# Patient Record
Sex: Female | Born: 1950 | Race: White | Hispanic: No | Marital: Married | Smoking: Never smoker
Health system: Southern US, Community
[De-identification: ages and names within clinical notes are randomized; demographics above are authoritative.]

## PROBLEM LIST (undated history)

## (undated) DIAGNOSIS — G9332 Myalgic encephalomyelitis/chronic fatigue syndrome: Secondary | ICD-10-CM

## (undated) DIAGNOSIS — M797 Fibromyalgia: Secondary | ICD-10-CM

## (undated) DIAGNOSIS — R5382 Chronic fatigue, unspecified: Secondary | ICD-10-CM

## (undated) DIAGNOSIS — N3010 Interstitial cystitis (chronic) without hematuria: Secondary | ICD-10-CM

## (undated) DIAGNOSIS — D849 Immunodeficiency, unspecified: Secondary | ICD-10-CM

## (undated) DIAGNOSIS — K589 Irritable bowel syndrome without diarrhea: Secondary | ICD-10-CM

## (undated) HISTORY — PX: APPENDECTOMY: SHX54

## (undated) HISTORY — PX: ABDOMINAL HYSTERECTOMY: SHX81

## (undated) HISTORY — PX: CHOLECYSTECTOMY: SHX55

---

## 1998-08-11 ENCOUNTER — Emergency Department (HOSPITAL_COMMUNITY): Admission: EM | Admit: 1998-08-11 | Discharge: 1998-08-11 | Payer: Self-pay | Admitting: Emergency Medicine

## 1998-08-11 ENCOUNTER — Encounter: Payer: Self-pay | Admitting: Emergency Medicine

## 1998-09-05 ENCOUNTER — Ambulatory Visit (HOSPITAL_COMMUNITY): Admission: RE | Admit: 1998-09-05 | Discharge: 1998-09-05 | Payer: Self-pay | Admitting: *Deleted

## 2000-12-24 ENCOUNTER — Ambulatory Visit (HOSPITAL_COMMUNITY): Admission: RE | Admit: 2000-12-24 | Discharge: 2000-12-24 | Payer: Self-pay | Admitting: *Deleted

## 2000-12-24 ENCOUNTER — Encounter (INDEPENDENT_AMBULATORY_CARE_PROVIDER_SITE_OTHER): Payer: Self-pay | Admitting: *Deleted

## 2001-02-08 ENCOUNTER — Encounter: Payer: Self-pay | Admitting: Urology

## 2001-02-11 ENCOUNTER — Encounter: Payer: Self-pay | Admitting: Urology

## 2001-02-11 ENCOUNTER — Encounter (INDEPENDENT_AMBULATORY_CARE_PROVIDER_SITE_OTHER): Payer: Self-pay | Admitting: Specialist

## 2001-02-11 ENCOUNTER — Ambulatory Visit (HOSPITAL_COMMUNITY): Admission: RE | Admit: 2001-02-11 | Discharge: 2001-02-11 | Payer: Self-pay | Admitting: Urology

## 2002-08-16 ENCOUNTER — Ambulatory Visit (HOSPITAL_COMMUNITY): Admission: RE | Admit: 2002-08-16 | Discharge: 2002-08-16 | Payer: Self-pay | Admitting: *Deleted

## 2003-05-23 ENCOUNTER — Encounter: Admission: RE | Admit: 2003-05-23 | Discharge: 2003-05-23 | Payer: Self-pay | Admitting: Urology

## 2003-05-25 ENCOUNTER — Ambulatory Visit (HOSPITAL_BASED_OUTPATIENT_CLINIC_OR_DEPARTMENT_OTHER): Admission: RE | Admit: 2003-05-25 | Discharge: 2003-05-25 | Payer: Self-pay | Admitting: Urology

## 2003-05-25 ENCOUNTER — Ambulatory Visit (HOSPITAL_COMMUNITY): Admission: RE | Admit: 2003-05-25 | Discharge: 2003-05-25 | Payer: Self-pay | Admitting: Urology

## 2004-04-29 ENCOUNTER — Ambulatory Visit (HOSPITAL_BASED_OUTPATIENT_CLINIC_OR_DEPARTMENT_OTHER): Admission: RE | Admit: 2004-04-29 | Discharge: 2004-04-29 | Payer: Self-pay | Admitting: Urology

## 2004-04-29 ENCOUNTER — Ambulatory Visit (HOSPITAL_COMMUNITY): Admission: RE | Admit: 2004-04-29 | Discharge: 2004-04-29 | Payer: Self-pay | Admitting: Urology

## 2005-05-15 ENCOUNTER — Ambulatory Visit (HOSPITAL_BASED_OUTPATIENT_CLINIC_OR_DEPARTMENT_OTHER): Admission: RE | Admit: 2005-05-15 | Discharge: 2005-05-15 | Payer: Self-pay | Admitting: Urology

## 2006-05-25 ENCOUNTER — Ambulatory Visit (HOSPITAL_BASED_OUTPATIENT_CLINIC_OR_DEPARTMENT_OTHER): Admission: RE | Admit: 2006-05-25 | Discharge: 2006-05-25 | Payer: Self-pay | Admitting: Urology

## 2007-05-10 ENCOUNTER — Ambulatory Visit (HOSPITAL_BASED_OUTPATIENT_CLINIC_OR_DEPARTMENT_OTHER): Admission: RE | Admit: 2007-05-10 | Discharge: 2007-05-10 | Payer: Self-pay | Admitting: Urology

## 2010-09-16 NOTE — Op Note (Signed)
NAME:  Connie Parker, Connie Parker NO.:  [id number]   MEDICAL RECORD NO.:  [id number]          PATIENT TYPE:  AMB   LOCATION:  [facility]                         FACILITY:  [facility]   PHYSICIAN:  Jamison Neighbor, M.D.  DATE OF BIRTH:  [birth year]   DATE OF PROCEDURE:  05/10/2007  DATE OF DISCHARGE:                               OPERATIVE REPORT   SERVICE:  Urology.   PREOPERATIVE DIAGNOSIS:  Interstitial cystitis/pelvic pain syndrome.   POSTOPERATIVE DIAGNOSIS:  Interstitial cystitis/pelvic pain syndrome.   PROCEDURE:  Cystoscopy, urethral calibration, hydrodistention of the  bladder, Marcaine and Pyridium installation, Marcaine and Kenalog  injection.   SURGEON:  Jamison Neighbor, M.D.   ANESTHESIA:  General.   COMPLICATIONS:  None.   DRAINS:  None.   BRIEF HISTORY:  This 60 year old female is known to have chronic pelvic  pain and has always responded in the past to hydrodistention.  The  patient has been on extensive medications for interstitial cystitis as  well as for her other problems including chronic fatigue syndrome,  fibromyalgia and irritable bowel syndrome.  She has requested a repeat  hydrodistention be performed.  She tends to get a good response.  This  can last at least a year or more.  She does know that there is no  guarantee that she will have a comparable response.  She gave full  informed consent.   PROCEDURE:  After successful induction of general anesthesia, the  patient was placed in the dorsal lithotomy position, prepped with  Betadine and draped in the usual sterile fashion.  Careful bimanual  examination revealed minimal pelvic laxity with no significant  cystocele.  The vault was pretty well-supported.  There was no real  rectocele.  The urethra was normal, accepting a 32-French female  urethral stent with no signs of stenosis or stricture.  The cystoscope  was inserted.  The bladder was carefully inspected.  No tumors or stones  could be seen.   Hydrodistention of the bladder was performed.  The  bladder was distended at a pressure of 100 cm of water for 5 minutes.  When the bladder was drained, no real granulations could be seen.  The  bladder capacity was normal at 1600 mL and if the patient did not have  previous evidence of a response to hydrodistention, one would consider  this to be a normal evaluation.  The bladder was drained.  A mixture of  Marcaine and Pyridium was left in the bladder.  Marcaine and Kenalog  mixture was injected periurethrally as a pudendal block.  The patient  tolerated the procedure  and was taken to the recovery room in good condition.  She received  intraoperative Toradol, Zofran as well as a B&O suppository.  She will  be sent home with Tylox, Pyridium Plus and doxycycline and return to the  office in 3 weeks time.      Jamison Neighbor, M.D.  Electronically Signed     RJE/MEDQ  D:  05/10/2007  T:  05/10/2007  Job:  [id number]

## 2010-09-19 NOTE — Op Note (Signed)
NAME:  Connie Parker, Connie Parker                         ACCOUNT NO.:  [id number]   MEDICAL RECORD NO.:  [id number]                   PATIENT TYPE:  AMB   LOCATION:  [facility]                                 FACILITY:  [facility]   PHYSICIAN:  Jamison Neighbor, M.D.               DATE OF BIRTH:  [birth year]   DATE OF PROCEDURE:  05/25/2003  DATE OF DISCHARGE:                                 OPERATIVE REPORT   PREOPERATIVE DIAGNOSIS:  Interstitial cystitis.   POSTOPERATIVE DIAGNOSIS:  Interstitial cystitis.   PROCEDURE:  Cystoscopy, urethral calibration, hydrodistention of the  bladder, Marcaine and Pyridium installation, Marcaine and Kenalog injection.   SURGEON:  Jamison Neighbor, M.D.   ANESTHESIA:  General.   COMPLICATIONS:  None.   DRAINS:  None.   BRIEF HISTORY:  This 60 year old female has interstitial cystitis and in  addition she suffers from a severe case of chronic fatigue syndrome.  The  patient had responded nicely to a combination of Elmiron, Atarax and  Neurontin as well as the medications that she takes for her chronic fatigue  syndrome which include __________, Wellbutrin, nizatidine, Maxzide,  Adderall, trazodone, Sinemet, Klonopin, Estraderm and NitroQuick for  esophageal spasms.  The patient recently, however, began to feel that her  symptoms were not as well controlled and she ascribes this to the fact that  she is now about two years out from her last hydrodistention.  We talked  about different options for the management of her recurrent frequency  including anticholinergics, bladder installation therapy, repeat  hydrodistention and Interstim. The patient would like to go ahead and be  redistended as she had such an excellent response to that the first time.  She is fully aware of the fact that there is no guarantee that that will  work the way it did before and that it may not last as long as the last  hydrodistention even if she does get a good clinical response. She  understands the risks and benefits of the procedure and gave full and  informed consent.   DESCRIPTION OF PROCEDURE:  After successful induction of general anesthesia,  the patient was placed in the dorsal lithotomy position, prepped with  Betadine and draped in the usual sterile fashion.  Careful bimanual  examination revealed no abnormalities of the urethra.  There was no  significant cystocele or rectocele, there was no vault prolapse or any  palpable masses. The urethra had no diverticulum. The urethra was calibrated  at 68 [place] with female urethral sounds without significant stenosis or  stricture. The cystoscope was inserted, the bladder was carefully inspected  and was free of any tumor or stones. Both ureteral orifices were normal in  configuration and location. The bladder mucosa had a very normal appearance  with no inflammatory changes, tumors or other abnormalities seen. The  ureters were each identified and were normal.  The urine coming out of each  was clear.  The hydrodistention was then performed, the bladder was  distended at a pressure of 100 cm of water for five minutes and when the  bladder was drained essentially no glomerulations were seen. This is a major  improvement from previous hdyrodistention. The patient's bladder capacity  has normalized at over 1500 mL.  The bladder was drained, a final inspection  showed nothing that needed biopsy.  A mixture of Marcaine and Pyridium was  left within the bladder, Marcaine and Kenalog were injected periurethrally.  The patient received intraoperative Toradol and Zofran.  She also received a  B&O suppository.  The patient tolerated the procedure well and was taken to  the recovery room in good condition. She will be sent home with Darvocet-N  100 which is one of the few pain medications she can tolerate as well as a  few days of Levaquin. She will return to my office in followup. If she has  not had a good clinical response  will talk to her about either installation  therapy or a trial of a newer anticholinergic such as ________.  The patient  has recently been on Oxytrol but still has frequency. The only other  alternative that we could consider is Interstim which may be a very  worthwhile therapy for this patient.                                               Jamison Neighbor, M.D.    RJE/MEDQ  D:  05/25/2003  T:  05/25/2003  Job:  [id number]   cc:   Hassie Bruce  [address]  [place]  [place] [address]  Fax: [phone]

## 2010-09-19 NOTE — Op Note (Signed)
[facility]  Patient:    Connie Parker, Connie Parker Visit Number: [id number] MRN: [id number]          Service Type: DSU Location: DAY Attending Physician:  Londell Moh Proc. Date: 02/11/01 Admit Date:  02/11/2001                             Operative Report  PREOPERATIVE DIAGNOSES: 1. Microscopic hematuria. 2. Pelvic pain, rule out interstitial cystitis.  POSTOPERATIVE DIAGNOSES: 1. Microscopic hematuria. 2. Pelvic pain, rule out interstitial cystitis.  OPERATION:  Cystoscopy, urethral calibration, hydrodistention of bladder, bilateral retrogrades, Marcaine and Pyridium instillation, Marcaine and Kenalog, bladder biopsy.  SURGEON:  Jamison Neighbor, M.D.  ANESTHESIA:  General.  COMPLICATIONS:  None.  DRAINS:  None.  BRIEF HISTORY:  This 60 year old female is known to suffer from irritable bowel syndrome, chronic fatigue syndrome, and fibromyalgia.  She has had chronic problems with lower urinary tract symptoms including what was thought to be recurring urinary tract infections.  The patient has had episodes where cultures were negative.  The patient has severe episodes of urinary urgency and frequency.  Because of the history of irritable bowel syndrome and chronic fatigue syndrome, it is felt she is at high risk for interstitial cystitis. The patient also has hematuria that has been unexplained and for that reason, requires cystoscopy and retrogrades and bladder biopsy.  The patient understands the risks and benefits of the procedure and gave full and informed consent.  The patient was informed prior to the procedure that a preoperative chest x-ray showed a possible lesion, so she will undergo a chest CT scan during this evaluation.  The patient gave full and informed consent.  DESCRIPTION OF PROCEDURE:  After successful induction of general anesthesia, the patient was placed in the dorsal lithotomy position and prepped with Betadine and  draped in the usual sterile fashion.  Careful bimanual examination revealed no abnormalities of the urethra.  There was no significant cystocele, rectocele, or enterocele.  There were no masses on bimanual exam.  The urethra was of normal caliber, accepting a 67 [place] with female urethral sound with no evidence of stenosis or stricture.  The cystoscope was inserted.  The bladder was carefully inspected.  It was free of any tumor or stones.  Both ureteral orifices were normal in configuration and location.  Bilateral retrogrades were performed.  On the right hand side there was normal ureter and normal collecting system.  Clips from the patients previous gallbladder were seen to superimpose on the kidney but obviously are not involving the kidney.  The drain-out film was normal.  An identical on the opposite side also showed completely normal ureter and collecting system. Hydrodistention of the bladder was performed.  The bladder was distended up to 1400 cc by instilling at 100 pressure.  When the bladder was drained, no glomerulations were seen.  This would suggest that the patient does not have IC, even though her symptoms and history are so classic for the condition. The patient had a bladder biopsy which will be sent for evaluation for carcinoma in situ as well as for mast cell analysis.  The biopsy site was cauterized.  A mixture of Marcaine and Pyridium was left within the bladder. Intraoperative Toradol and Zofran were given.  The patient had a B&O suppository inserted.  A periurethral block was also performed with Marcaine and Kenalog.  The patient tolerated the procedure well and was taken to  the recovery room in good condition.  She will be sent home with Lorcet Plus as needed for pain, Pyridium Plus as needed for burning and spasm, and will be covered with Levaquin.  She will have a chest CT today and return to my office in follow-up. Attending Physician:  Londell Moh DD:  02/11/01 TD:  02/11/01 Job: [id number]

## 2010-09-19 NOTE — Op Note (Signed)
NAME:  Connie Parker, Connie Parker NO.:  [id number]   MEDICAL RECORD NO.:  [id number]          PATIENT TYPE:  AMB   LOCATION:  [facility]                         FACILITY:  [facility]   PHYSICIAN:  Jamison Neighbor, M.D.  DATE OF BIRTH:  12-18-1950   DATE OF PROCEDURE:  05/15/2005  DATE OF DISCHARGE:                                 OPERATIVE REPORT   PREOPERATIVE DIAGNOSIS:  Interstitial cystitis.   POSTOPERATIVE DIAGNOSIS:  Interstitial cystitis.   PROCEDURE:  Cystoscopy, urethral calibration, hydrodistention of the  bladder, Marcaine and Pyridium instillation, Marcaine and Kenalog injection.   SURGEON:  Jamison Neighbor, M.D.   ANESTHESIA:  General.   COMPLICATIONS:  None.   DRAINS:  None.   BRIEF HISTORY:  This 60 year old female has a past history of interstitial  cystitis as well as problems with chronic fatigue syndrome.  The patient has  had a worsening of her symptoms despite appropriate therapy for IC and has  requested a repeat hydrodistention be performed.  She has usually had  improvement of almost a year.  She understands that there is no guarantee  that will happen again but that she hopes this will allow her to require  less medications and have better control of her lower urinary tract  symptoms.  She understands the risks and benefits of the procedure and gave  full informed consent.   PROCEDURE:  After the successful induction of general anesthesia, the  patient is placed in a dorsal lithotomy position, prepped with Betadine, and  draped in the usual sterile fashion.  Careful bimanual examination showed no  abnormalities of the vaginal vault.  There was no cystocele, rectocele, or  enterocele to speak of.  There were no masses on bimanual exam.  The urethra  was calibrated to a 21 [place] with female urethral sounds with no signs of  stenosis or stricture.  The cystoscope was inserted.  The bladder was  carefully inspected.  It was free of any tumor or stones.   Both ureteral  orifices were normal in configuration and location.  Bladder distention was  then performed.  The bladder now has a normal bladder capacity of 1400 cc,  where previously she had classic IC findings with diminished bladder  capacity and glomerulation formation.  This time, the bladder capacity is  normal, and no glomerulations could be seen.  This would suggest that she  has had a major improvement in her bladder from her  long-term therapy.  The patient did not require a biopsy.  A mixture of  Marcaine and Pyridium was placed in the bladder.  Marcaine and Kenalog were  injected periurethrally.  The patient tolerated the procedure well and was  taken to the recovery room in good condition.           ______________________________  Jamison Neighbor, M.D.  Electronically Signed     RJE/MEDQ  D:  05/15/2005  T:  05/15/2005  Job:  [id number]   cc:   Dr. Jerilynn Birkenhead, [place]

## 2010-09-19 NOTE — Op Note (Signed)
NAME:  Connie Parker, Connie Parker NO.:  [id number]   MEDICAL RECORD NO.:  [id number]          PATIENT TYPE:  AMB   LOCATION:  [facility]                         [facility]ACILITY:  [facility]   PHYSICIAN:  Jamison Neighbor, M.D.  DATE OF BIRTH:  1950/08/03   DATE OF PROCEDURE:  05/25/2006  DATE OF DISCHARGE:                               OPERATIVE REPORT   SERVICE:  Urology.   PREOPERATIVE DIAGNOSIS:  Interstitial cystitis with secondary diagnosis  of voiding dysfunction.   POSTOPERATIVE DIAGNOSIS:  Interstitial cystitis with secondary diagnosis  of voiding dysfunction.   PROCEDURE:  Cystoscopy, urethral calibration, hydrodistention of the  bladder, Marcaine and Pyridium installation, Marcaine and Kenalog  injection.   SURGEON:  Dr. Marcelyn Bruins.   ANESTHESIA:  General.   COMPLICATIONS:  None.   DRAINS:  None.   BRIEF HISTORY:  This 60 year old female has had problems with chronic  pelvic pain and voiding dysfunction.  The patient then it is not felt to  have a significantly diminished bladder capacity but has responded  surprisingly well to hydrodistentions in the past.  She has requested  that this be performed.  She knows that there is no guarantee she will  have a comparable response but she has generally responded well every  previous time. She gave full informed consent.   DESCRIPTION OF PROCEDURE:  After successful induction of general  anesthesia, the patient was placed in the dorsal position, prepped with  Betadine and draped in the usual sterile fashion.  Careful bimanual  examination revealed a modest cystocele, fairly good support for the  vaginal vault and no prolapse.  The urethra was calibrated at 32-French  with female urethral sounds with no evidence of stenosis or stricture.  The cystoscope was inserted.  The bladder was carefully inspected and  was free of any tumor or stones.  Both ureteral orifices were normal in  configuration and location. Clear urine seen to  efflux from each.  Hydrodistention of the bladder was then performed.  The bladder was  distended at a pressure 100 cmH2O water for 5 minutes. When the bladder  was drained very little in the way of glomerulations could be seen. The  bladder capacity has normalized now and 1500 mL. What was noted before  and after distension is marked trabeculation of the bladder consistent  with pelvic floor dysfunction and ongoing voiding dysfunction.  The  bladder was drained, there was nothing required biopsy. A mixture of  Marcaine and Kenalog was left in the bladder. Marcaine and Kenalog were  injected periurethrally. The patient tolerated the  procedure well and was taken to the recovery room in good condition.  She has pain medication at home.  She will be sent home with several  days of antibiotic as well as urinary analgesics. When she returns if  there has been no improvement, we will offer ongoing physical therapy as  well as possible evaluation for neuromodulation therapy.           ______________________________  Jamison Neighbor, M.D.  Electronically Signed     RJE/MEDQ  D:  05/25/2006  T:  05/25/2006  Job:  [id number]

## 2010-09-19 NOTE — Op Note (Signed)
NAME:  Connie Parker, Connie Parker NO.:  [id number]   MEDICAL RECORD NO.:  [id number]          PATIENT TYPE:  AMB   LOCATION:  [facility]                         FACILITY:  [facility]   PHYSICIAN:  Jamison Neighbor, M.D.  DATE OF BIRTH:  03/23/1951   DATE OF PROCEDURE:  04/29/2004  DATE OF DISCHARGE:                                 OPERATIVE REPORT   PREOPERATIVE DIAGNOSES:  Interstitial cystitis/chronic pelvic pain syndrome.   POSTOPERATIVE DIAGNOSES:  Interstitial cystitis/chronic pelvic pain  syndrome.   PROCEDURE:  Cystoscopy, urethral calibration, hydrodistention of the  bladder, Marcaine and pyridium instillation, Marcaine and Kenalog  injections.   SURGEON:  Jamison Neighbor, M.D.   ANESTHESIA:  General.   COMPLICATIONS:  None.   DRAINS:  None.   BRIEF HISTORY:  This 60 year old female has numerous illnesses including  fibromyalgia, chronic fatigue syndrome, irritable bowel syndrome, and  symptoms of interstitial cystitis.  The patient has had multiple family  problems that have made things worse for her.  We felt that she would do  reasonably well in terms of her bladder, but she had a taken a turn for the  worse and has requested a repeat hydrodistention of the bladder.  The  patient understands the risks and benefits of the procedure and gave full,  informed consent.   DESCRIPTION OF PROCEDURE:  After a successful induction of general  anesthesia, the patient was placed in the dorsal lithotomy position, prepped  with Betadine, and draped in the usual sterile fashion.  Careful bimanual  examination revealed no abnormalities of the urethra.  The patient had no  significant cystocele, rectocele, or anocele.  There were no masses on  bimanual exam.  The patient had no evidence of a diverticulum.  There was no  rectocele or other abnormalities detected.  The urethra was calibrated to 54-  [place] with Dema urethral sounds with no evidence of stenosis or stricture.  The  cystoscope was inserted. The bladder was carefully inspected.  It was  free of any tumor or stones. Both ureteral orifices were normal in  configuration and location.  Hydrodistention of the bladder was then  performed, and the patient was filled to a pressure of 170 cm of water and  was left filled for 5 minutes. When the bladder was drained, very little in  the way of glomerularization could be seen, and the patient had normal  bladder capacity of over 1600 cc.  If the patient did not have a pre-  existing diagnosis of IC and classic symptoms of this disease, the patient  would be felt to have a normal bladder.  Certainly, it would indicate that  the  patient is responding to therapy.  The bladder was drained.  A mixture of  Marcaine and pyridium was instilled into the bladder.  Marcaine and Kenalog  were injected periurethrally.  The patient tolerated the procedure and was  taken to the recovery room in good condition.      RJE/MEDQ  D:  04/29/2004  T:  04/29/2004  Job:  [id number]

## 2011-01-21 LAB — POCT I-STAT 4, (NA,K, GLUC, HGB,HCT)
Glucose, Bld: 107 — ABNORMAL HIGH
HCT: 45
Hemoglobin: 15.3 — ABNORMAL HIGH
Operator id: 114531
Potassium: 3.7
Sodium: 136

## 2013-01-06 ENCOUNTER — Emergency Department (HOSPITAL_BASED_OUTPATIENT_CLINIC_OR_DEPARTMENT_OTHER)
Admission: EM | Admit: 2013-01-06 | Discharge: 2013-01-06 | Disposition: A | Discharge status: Home / Self Care | Payer: Medicare Other | Attending: Emergency Medicine | Admitting: Emergency Medicine

## 2013-01-06 ENCOUNTER — Emergency Department (HOSPITAL_BASED_OUTPATIENT_CLINIC_OR_DEPARTMENT_OTHER): Payer: Medicare Other

## 2013-01-06 ENCOUNTER — Encounter (HOSPITAL_BASED_OUTPATIENT_CLINIC_OR_DEPARTMENT_OTHER): Payer: Self-pay | Admitting: *Deleted

## 2013-01-06 DIAGNOSIS — Z862 Personal history of diseases of the blood and blood-forming organs and certain disorders involving the immune mechanism: Secondary | ICD-10-CM | POA: Insufficient documentation

## 2013-01-06 DIAGNOSIS — Z90710 Acquired absence of both cervix and uterus: Secondary | ICD-10-CM | POA: Insufficient documentation

## 2013-01-06 DIAGNOSIS — Z8639 Personal history of other endocrine, nutritional and metabolic disease: Secondary | ICD-10-CM | POA: Insufficient documentation

## 2013-01-06 DIAGNOSIS — Z8742 Personal history of other diseases of the female genital tract: Secondary | ICD-10-CM | POA: Insufficient documentation

## 2013-01-06 DIAGNOSIS — R109 Unspecified abdominal pain: Secondary | ICD-10-CM | POA: Insufficient documentation

## 2013-01-06 DIAGNOSIS — Z8719 Personal history of other diseases of the digestive system: Secondary | ICD-10-CM | POA: Insufficient documentation

## 2013-01-06 DIAGNOSIS — Z9089 Acquired absence of other organs: Secondary | ICD-10-CM | POA: Insufficient documentation

## 2013-01-06 HISTORY — DX: Irritable bowel syndrome, unspecified: K58.9

## 2013-01-06 HISTORY — DX: Interstitial cystitis (chronic) without hematuria: N30.10

## 2013-01-06 HISTORY — DX: Fibromyalgia: M79.7

## 2013-01-06 HISTORY — DX: Immunodeficiency, unspecified: D84.9

## 2013-01-06 LAB — CBC WITH DIFFERENTIAL/PLATELET
Eosinophils Absolute: 0.2 10*3/uL (ref 0.0–0.7)
Eosinophils Relative: 3 % (ref 0–5)
HCT: 44.3 % (ref 36.0–46.0)
Hemoglobin: 15 g/dL (ref 12.0–15.0)
Lymphocytes Relative: 37 % (ref 12–46)
Lymphs Abs: 2.6 10*3/uL (ref 0.7–4.0)
MCH: 30.7 pg (ref 26.0–34.0)
MCV: 90.6 fL (ref 78.0–100.0)
Monocytes Absolute: 0.6 10*3/uL (ref 0.1–1.0)
Monocytes Relative: 8 % (ref 3–12)
Platelets: 189 10*3/uL (ref 150–400)
RBC: 4.89 MIL/uL (ref 3.87–5.11)

## 2013-01-06 LAB — TROPONIN I: Troponin I: <0.3 ng/mL (ref <0.30)

## 2013-01-06 LAB — URINALYSIS, ROUTINE W REFLEX MICROSCOPIC
Bilirubin Urine: NEGATIVE
Glucose, UA: NEGATIVE mg/dL
Hgb urine dipstick: NEGATIVE
Protein, ur: NEGATIVE mg/dL
Urobilinogen, UA: 0.2 mg/dL (ref 0.0–1.0)

## 2013-01-06 LAB — BASIC METABOLIC PANEL
BUN: 11 mg/dL (ref 6–23)
CO2: 24 mEq/L (ref 19–32)
Calcium: 9.8 mg/dL (ref 8.4–10.5)
Creatinine, Ser: 1 mg/dL (ref 0.50–1.10)
GFR calc non Af Amer: 58 mL/min — ABNORMAL LOW (ref >90)
Glucose, Bld: 103 mg/dL — ABNORMAL HIGH (ref 70–99)

## 2013-01-06 MED ORDER — FENTANYL CITRATE 0.05 MG/ML IJ SOLN
100.0000 ug | Freq: Once | INTRAMUSCULAR | Status: AC
  Administered 2013-01-06: 100 ug via INTRAVENOUS

## 2013-01-06 MED ORDER — ONDANSETRON HCL 4 MG/2ML IJ SOLN
4.0000 mg | Freq: Once | INTRAMUSCULAR | Status: AC
  Administered 2013-01-06: 4 mg via INTRAVENOUS
  Filled 2013-01-06: qty 2

## 2013-01-06 MED ORDER — KETOROLAC TROMETHAMINE 30 MG/ML IJ SOLN
30.0000 mg | Freq: Once | INTRAMUSCULAR | Status: AC
  Administered 2013-01-06: 30 mg via INTRAVENOUS
  Filled 2013-01-06: qty 1

## 2013-01-06 MED ORDER — MORPHINE SULFATE 4 MG/ML IJ SOLN
4.0000 mg | Freq: Once | INTRAMUSCULAR | Status: AC
  Administered 2013-01-06: 4 mg via INTRAVENOUS
  Filled 2013-01-06: qty 1

## 2013-01-06 MED ORDER — DICYCLOMINE HCL 10 MG PO CAPS
10.0000 mg | ORAL_CAPSULE | Freq: Once | ORAL | Status: AC
  Administered 2013-01-06: 10 mg via ORAL
  Filled 2013-01-06: qty 1

## 2013-01-06 MED ORDER — DICYCLOMINE HCL 20 MG PO TABS: 20.0000 mg | ORAL_TABLET | Freq: Two times a day (BID) | ORAL | Status: AC

## 2013-01-06 MED ORDER — FENTANYL CITRATE 0.05 MG/ML IJ SOLN
INTRAMUSCULAR | Status: AC
  Administered 2013-01-06: 100 ug via INTRAVENOUS
  Filled 2013-01-06: qty 2

## 2013-01-06 MED ORDER — SUCRALFATE 1 GM/10ML PO SUSP: 1.0000 g | Freq: Four times a day (QID) | ORAL | Status: AC

## 2013-01-06 MED ORDER — GI COCKTAIL ~~LOC~~
30.0000 mL | Freq: Once | ORAL | Status: AC
  Administered 2013-01-06: 30 mL via ORAL
  Filled 2013-01-06: qty 30

## 2013-01-06 NOTE — ED Notes (Signed)
Patient transported to CT 

## 2013-01-06 NOTE — ED Provider Notes (Addendum)
CSN: [id number]     Arrival date & time 01/06/13  0527 History   First MD Initiated Contact with Patient 01/06/13 [phone]     Chief Complaint  Patient presents with  . Flank Pain   (Consider location/radiation/quality/duration/timing/severity/associated sxs/prior Treatment) Patient is a 62 y.o. female presenting with flank pain. The history is provided by the patient. No language interpreter was used.  Flank Pain This is a new problem. The current episode started yesterday. The problem occurs constantly. The problem has not changed (originally told nurse in triage it resolved and came back now states there has been some pain constantly x 24 hours) since onset.Pertinent negatives include no chest pain, no headaches and no shortness of breath. Nothing aggravates the symptoms. Nothing relieves the symptoms. She has tried nothing for the symptoms. The treatment provided no relief.  Right flank pain that patient initially reported radiated to the right groin with constant pain and superimposed waves of sharp pain.  No trauma.  No f/c/r  Past Medical History  Diagnosis Date  . Immune deficiency disorder   . Fibromyalgia   . Interstitial cystitis   . IBS (irritable bowel syndrome)    Past Surgical History  Procedure Laterality Date  . Appendectomy    . Abdominal hysterectomy    . Cholecystectomy     No family history on file. History  Substance Use Topics  . Smoking status: Never Smoker   . Smokeless tobacco: Not on file  . Alcohol Use: 0.6 oz/week    1 Glasses of wine per week   OB History   Grav Para Term Preterm Abortions TAB SAB Ect Mult Living                 Review of Systems  Constitutional: Negative for fever.  HENT: Negative for neck pain.   Respiratory: Negative for shortness of breath.   Cardiovascular: Negative for chest pain, palpitations and leg swelling.  Gastrointestinal: Negative for vomiting and constipation.  Genitourinary: Positive for flank pain. Negative for  dysuria and hematuria.  Neurological: Negative for headaches.  All other systems reviewed and are negative.    Allergies  Codeine  Home Medications  No current outpatient prescriptions on file. BP 147/78  Pulse 71  Temp(Src) 97.5 F (36.4 C) (Oral)  Resp 18  Wt 185 lb (83.915 kg)  SpO2 100% Physical Exam  Constitutional: She is oriented to person, place, and time. She appears well-developed and well-nourished. No distress.  HENT:  Head: Normocephalic and atraumatic.  Mouth/Throat: Oropharynx is clear and moist.  Eyes: Conjunctivae are normal. Pupils are equal, round, and reactive to light.  Neck: Normal range of motion. Neck supple.  Cardiovascular: Normal rate and regular rhythm.   Pulmonary/Chest: Effort normal and breath sounds normal. She has no wheezes. She has no rales.  Abdominal: Soft. Bowel sounds are increased. There is no tenderness. There is no rebound and no guarding.    Musculoskeletal: Normal range of motion.  Neurological: She is alert and oriented to person, place, and time.  Skin: Skin is warm and dry.  Psychiatric: She has a normal mood and affect.    ED Course  Procedures (including critical care time) Labs Review Labs Reviewed  CBC WITH DIFFERENTIAL  BASIC METABOLIC PANEL  URINALYSIS, ROUTINE W REFLEX MICROSCOPIC   Imaging Review No results found.  MDM   Date: 01/06/2013  Rate: 67  Rhythm: normal sinus rhythm  QRS Axis: normal  Intervals: normal  ST/T Wave abnormalities: normal  Conduction Disutrbances:  none  Narrative Interpretation: one pvc otherwise normal  Ddx: Gas vs.  IBS Kidney stone Gerd or gastritis    Pain now moving around and story changing.   In the setting of > 8 hours of continued ongoing symptoms with negative ekg and troponin acs is excluded.  No SOB.  CT is normal, labs or normal.  Will refer to GI for ongoing evaluation  Connie Parker K Zubin Pontillo-Rasch, MD 01/06/13 0708  Connie Parker K Carlena Ruybal-Rasch, MD 01/06/13 2258

## 2013-01-06 NOTE — ED Notes (Signed)
Pt c/o mild right flank pain that began yesterday but then resolved. This morning the pain returned but much worse.

## 2013-01-08 ENCOUNTER — Emergency Department (HOSPITAL_BASED_OUTPATIENT_CLINIC_OR_DEPARTMENT_OTHER)
Admission: EM | Admit: 2013-01-08 | Discharge: 2013-01-08 | Disposition: A | Discharge status: Home / Self Care | Payer: Medicare Other | Attending: Emergency Medicine | Admitting: Emergency Medicine

## 2013-01-08 ENCOUNTER — Encounter (HOSPITAL_BASED_OUTPATIENT_CLINIC_OR_DEPARTMENT_OTHER): Payer: Self-pay | Admitting: Emergency Medicine

## 2013-01-08 DIAGNOSIS — Z90710 Acquired absence of both cervix and uterus: Secondary | ICD-10-CM | POA: Insufficient documentation

## 2013-01-08 DIAGNOSIS — Z79899 Other long term (current) drug therapy: Secondary | ICD-10-CM | POA: Insufficient documentation

## 2013-01-08 DIAGNOSIS — N3010 Interstitial cystitis (chronic) without hematuria: Secondary | ICD-10-CM | POA: Insufficient documentation

## 2013-01-08 DIAGNOSIS — R109 Unspecified abdominal pain: Secondary | ICD-10-CM | POA: Insufficient documentation

## 2013-01-08 DIAGNOSIS — K589 Irritable bowel syndrome without diarrhea: Secondary | ICD-10-CM | POA: Insufficient documentation

## 2013-01-08 DIAGNOSIS — Z9089 Acquired absence of other organs: Secondary | ICD-10-CM | POA: Insufficient documentation

## 2013-01-08 DIAGNOSIS — IMO0001 Reserved for inherently not codable concepts without codable children: Secondary | ICD-10-CM | POA: Insufficient documentation

## 2013-01-08 HISTORY — DX: Myalgic encephalomyelitis/chronic fatigue syndrome: G93.32

## 2013-01-08 HISTORY — DX: Chronic fatigue, unspecified: R53.82

## 2013-01-08 LAB — COMPREHENSIVE METABOLIC PANEL
ALT: 56 U/L — ABNORMAL HIGH (ref 0–35)
AST: 36 U/L (ref 0–37)
CO2: 23 mEq/L (ref 19–32)
Calcium: 10.3 mg/dL (ref 8.4–10.5)
Chloride: 102 mEq/L (ref 96–112)
GFR calc non Af Amer: 66 mL/min — ABNORMAL LOW (ref >90)
Sodium: 141 mEq/L (ref 135–145)

## 2013-01-08 LAB — LIPASE, BLOOD: Lipase: 28 U/L (ref 11–59)

## 2013-01-08 LAB — CBC
Hemoglobin: 15.2 g/dL — ABNORMAL HIGH (ref 12.0–15.0)
MCHC: 34.1 g/dL (ref 30.0–36.0)
RBC: 4.94 MIL/uL (ref 3.87–5.11)
WBC: 6.8 10*3/uL (ref 4.0–10.5)

## 2013-01-08 MED ORDER — OXYCODONE-ACETAMINOPHEN 5-325 MG PO TABS: 1.0000 | ORAL_TABLET | Freq: Four times a day (QID) | ORAL | Status: DC | PRN

## 2013-01-08 MED ORDER — HYDROMORPHONE HCL PF 1 MG/ML IJ SOLN
1.0000 mg | Freq: Once | INTRAMUSCULAR | Status: AC
  Administered 2013-01-08: 1 mg via INTRAVENOUS
  Filled 2013-01-08: qty 1

## 2013-01-08 MED ORDER — HYDROMORPHONE HCL PF 2 MG/ML IJ SOLN
1.5000 mg | Freq: Once | INTRAMUSCULAR | Status: AC
  Administered 2013-01-08: 1.5 mg via INTRAVENOUS
  Filled 2013-01-08: qty 1

## 2013-01-08 NOTE — ED Provider Notes (Signed)
CSN: [id number]     Arrival date & time 01/08/13  1812 History  This chart was scribed for [name], MD by [name], ED Scribe. This patient was seen in room MH12/MH12 and the patient's care was started 6:54 PM.    Chief Complaint  Patient presents with  . Abdominal Pain   The history is provided by the patient. No language interpreter was used.   HPI Comments: Connie Parker is a 62 y.o. female with h/o cholecystectomy, hysterectomy, appendectomy, and IBS presents to the Emergency Department complaining of constant, worsened abdominal pain that initially began 3 days ago.  She was seen here on 01/06/13 for the same symptoms and had a negative CT was diagnosed with esophageal spasms. The pain has been persistent since she was seen but has worsened today. She describes the pain as sharp. Pt was prescribed bentyl and carafate in the ED with no relief. She has not taken anything else for pain. She reports having a decreased appetite. Pt's last normal bowel movement was this morning. She states that she had similar pain prior to having a cholecystectomy. Pt states that the pain does not feel similar to pain she has from IBS. Pt has h/o chronic fatigue syndrome, fibromyalgia. Pt denies nausea, emesis, fever. Patient has an appointment with Dr. [name] at [facility] on 01/17/13. Nothing makes symptoms but are worse. Patient had noncontrast CT scan of abdomen and pelvis performed on file 2014 which showed no acute disease  Pt does not smoke or use alcohol. She denies illicit drug use.  PCP is Dr. [name]. Urologist is Dr. [name] at [facility]  Past Medical History  Diagnosis Date  . Immune deficiency disorder   . Fibromyalgia   . Interstitial cystitis   . IBS (irritable bowel syndrome)   . Chronic fatigue syndrome    Past Surgical History  Procedure Laterality Date  . Appendectomy    . Abdominal hysterectomy    . Cholecystectomy     History reviewed. No pertinent family  history. History  Substance Use Topics  . Smoking status: Never Smoker   . Smokeless tobacco: Not on file  . Alcohol Use: 0.6 oz/week    1 Glasses of wine per week   OB History   Grav Para Term Preterm Abortions TAB SAB Ect Mult Living                 Review of Systems  Constitutional: Positive for appetite change. Negative for fever.  HENT: Negative.   Respiratory: Negative.   Cardiovascular: Negative.   Gastrointestinal: Positive for abdominal pain. Negative for nausea and vomiting.  Musculoskeletal: Negative.   Skin: Negative.   Neurological: Negative.   Psychiatric/Behavioral: Negative.   All other systems reviewed and are negative.    Allergies  Codeine  Home Medications   Current Outpatient Rx  Name  Route  Sig  Dispense  Refill  . Ascorbic Acid (VITAMIN C) 1000 MG tablet   Oral   Take 1,000 mg by mouth daily.         [name] buPROPion (WELLBUTRIN XL) 300 MG 24 hr tablet   Oral   Take 300 mg by mouth daily.         . calcium citrate (CALCITRATE - DOSED IN MG ELEMENTAL CALCIUM) 950 MG tablet   Oral   Take 1 tablet by mouth daily.         . cholecalciferol (VITAMIN D) 1000 UNITS tablet   Oral   Take 3,000 Units  by mouth daily.         . clonazePAM (KLONOPIN) 1 MG tablet   Oral   Take 1 mg by mouth at bedtime as needed for anxiety.         [name] estradiol (VIVELLE-DOT) 0.05 MG/24HR patch   Transdermal   Place 1 patch onto the skin once a week.         . ezetimibe (ZETIA) 10 MG tablet   Oral   Take 10 mg by mouth daily.         . ferrous gluconate (FERGON) 325 MG tablet   Oral   Take 325 mg by mouth daily with breakfast.         . gabapentin (NEURONTIN) 600 MG tablet   Oral   Take 600 mg by mouth 3 (three) times daily.         [name] glucosamine-chondroitin 500-400 MG tablet   Oral   Take 1 tablet by mouth 3 (three) times daily.         . hydrOXYzine (ATARAX/VISTARIL) 25 MG tablet   Oral   Take 50 mg by mouth at bedtime as needed for  itching.         . hyoscyamine (ANASPAZ) 0.125 MG TBDP tablet   Sublingual   Place 0.375 mg under the tongue.         . magnesium chloride (SLOW-MAG) 64 MG TBEC SR tablet   Oral   Take 2 tablets by mouth daily.         . multivitamin-iron-minerals-folic acid (CENTRUM) chewable tablet   Oral   Chew 1 tablet by mouth daily.         . nebivolol (BYSTOLIC) 10 MG tablet   Oral   Take 10 mg by mouth daily.         . nizatidine (AXID) 150 MG capsule   Oral   Take 150 mg by mouth 2 (two) times daily.         . pentosan polysulfate (ELMIRON) 100 MG capsule   Oral   Take 200 mg by mouth 2 (two) times daily.         . potassium chloride (K-DUR,KLOR-CON) 10 MEQ tablet   Oral   Take 10 mEq by mouth 2 (two) times daily.         [name] rOPINIRole (REQUIP) 1 MG tablet   Oral   Take 2 mg by mouth at bedtime.         . traZODone (DESYREL) 50 MG tablet   Oral   Take 50 mg by mouth at bedtime.         . dicyclomine (BENTYL) 20 MG tablet   Oral   Take 1 tablet (20 mg total) by mouth 2 (two) times daily.   14 tablet   0   . sucralfate (CARAFATE) 1 GM/10ML suspension   Oral   Take 10 mLs (1 g total) by mouth 4 (four) times daily.   420 mL   0    BP 134/71  Pulse 69  Temp(Src) 98.5 F (36.9 C) (Oral)  Resp 14  Ht 5\' 8"  (1.727 m)  Wt 180 lb (81.647 kg)  BMI 27.38 kg/m2  SpO2 100% Physical Exam  Nursing note and vitals reviewed. Constitutional: She appears well-developed and well-nourished.  HENT:  Head: Normocephalic and atraumatic.  Eyes: Conjunctivae are normal. Pupils are equal, round, and reactive to light.  Neck: Neck supple. No tracheal deviation present. No thyromegaly present.  Cardiovascular: Normal rate, regular rhythm  and normal heart sounds.   No murmur heard. Pulmonary/Chest: Effort normal and breath sounds normal.  Abdominal: Soft. Bowel sounds are normal. She exhibits no distension. There is tenderness (tender to RUQ).  Musculoskeletal:  Normal range of motion. She exhibits no edema and no tenderness.  Neurological: She is alert. Coordination normal.  Skin: Skin is warm and dry. No rash noted.  Psychiatric: She has a normal mood and affect.     ED Course  Procedures (including critical care time) DIAGNOSTIC STUDIES: Oxygen Saturation is 100% on room air, normal by my interpretation.    COORDINATION OF CARE: 8:46 PM-Will give 1 mg of Dilaudid. Will order CBC, lipase, and CMP. Discussed treatment plan with pt at bedside and pt agreed to plan.   Labs Review  Results for orders placed during the hospital encounter of 01/08/13  CBC      Result Value Range   WBC 6.8  4.0 - 10.5 K/uL   RBC 4.94  3.87 - 5.11 MIL/uL   Hemoglobin 15.2 (*) 12.0 - 15.0 g/dL   HCT 09.8  11.9 - 14.7 %   MCV 90.3  78.0 - 100.0 fL   MCH 30.8  26.0 - 34.0 pg   MCHC 34.1  30.0 - 36.0 g/dL   RDW 82.9  56.2 - 13.0 %   Platelets 205  150 - 400 K/uL  LIPASE, BLOOD      Result Value Range   Lipase 28  11 - 59 U/L  COMPREHENSIVE METABOLIC PANEL      Result Value Range   Sodium 141  135 - 145 mEq/L   Potassium 4.0  3.5 - 5.1 mEq/L   Chloride 102  96 - 112 mEq/L   CO2 23  19 - 32 mEq/L   Glucose, Bld 99  70 - 99 mg/dL   BUN 7  6 - 23 mg/dL   Creatinine, Ser 8.65  0.50 - 1.10 mg/dL   Calcium 78.4  8.4 - 69.6 mg/dL   Total Protein 6.9  6.0 - 8.3 g/dL   Albumin 4.0  3.5 - 5.2 g/dL   AST 36  0 - 37 U/L   ALT 56 (*) 0 - 35 U/L   Alkaline Phosphatase 88  39 - 117 U/L   Total Bilirubin 0.4  0.3 - 1.2 mg/dL   GFR calc non Af Amer 66 (*) >90 mL/min   GFR calc Af Amer 77 (*) >90 mL/min   Ct Abdomen Pelvis Wo Contrast  01/06/2013   *RADIOLOGY REPORT*  Clinical Data: Flank pain.  CT ABDOMEN AND PELVIS WITHOUT CONTRAST  Technique:  Multidetector CT imaging of the abdomen and pelvis was performed following the standard protocol without intravenous contrast.  Comparison: 08/24/2008.  Findings:  LOWER CHEST:  Mediastinum: Unremarkable.  Lungs/pleura: No  consolidation.  ABDOMEN/PELVIS:  Liver: No focal abnormality.  Biliary: Cholecystectomy.  Pancreas: Unremarkable.  Spleen: Unremarkable.  Adrenals: Unremarkable.  Kidneys and ureters: No hydronephrosis or stone. 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  Bladder: Unremarkable.  Bowel: No obstruction. No pericecal inflammatory changes.  Colonic diverticulosis.  Retroperitoneum: No mass or adenopathy.  Peritoneum: No free fluid or gas.  Reproductive: Hysterectomy.  Vascular: No acute abnormality.  OSSEOUS: Multilevel degenerative disc and facet disease, disc narrowing most notable at L3-4 L4-5.  IMPRESSION:  1.  No hydronephrosis or urolithiasis. 2. Colonic diverticulosis.   Original Report Authenticated By: Tiburcio Pea   Results for orders placed during the hospital encounter of 01/08/13  CBC      Result Value Range   WBC 6.8  4.0 - 10.5 K/uL   RBC 4.94  3.87 - 5.11 MIL/uL   Hemoglobin 15.2 (*) 12.0 - 15.0 g/dL   HCT 16.1  09.6 - 04.5 %   MCV 90.3  78.0 - 100.0 fL   MCH 30.8  26.0 - 34.0 pg   MCHC 34.1  30.0 - 36.0 g/dL   RDW 40.9  81.1 - 91.4 %   Platelets 205  150 - 400 K/uL  LIPASE, BLOOD      Result Value Range   Lipase 28  11 - 59 U/L  COMPREHENSIVE METABOLIC PANEL      Result Value Range   Sodium 141  135 - 145 mEq/L   Potassium 4.0  3.5 - 5.1 mEq/L   Chloride 102  96 - 112 mEq/L   CO2 23  19 - 32 mEq/L   Glucose, Bld 99  70 - 99 mg/dL   BUN 7  6 - 23 mg/dL   Creatinine, Ser 7.82  0.50 - 1.10 mg/dL   Calcium 95.6  8.4 - 21.3 mg/dL   Total Protein 6.9  6.0 - 8.3 g/dL   Albumin 4.0  3.5 - 5.2 g/dL   AST 36  0 - 37 U/L   ALT 56 (*) 0 - 35 U/L   Alkaline Phosphatase 88  39 - 117 U/L   Total Bilirubin 0.4  0.3 - 1.2 mg/dL   GFR calc non Af Amer 66 (*) >90 mL/min   GFR calc Af Amer 77 (*) >90 mL/min   Ct Abdomen Pelvis Wo Contrast  01/06/2013   *RADIOLOGY REPORT*  Clinical Data: Flank pain.  CT ABDOMEN AND PELVIS WITHOUT CONTRAST  Technique:   Multidetector CT imaging of the abdomen and pelvis was performed following the standard protocol without intravenous contrast.  Comparison: 08/24/2008.  Findings:  LOWER CHEST:  Mediastinum: Unremarkable.  Lungs/pleura: No consolidation.  ABDOMEN/PELVIS:  Liver: No focal abnormality.  Biliary: Cholecystectomy.  Pancreas: Unremarkable.  Spleen: Unremarkable.  Adrenals: Unremarkable.  Kidneys and ureters: No hydronephrosis or stone. 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  Bladder: Unremarkable.  Bowel: No obstruction. No pericecal inflammatory changes.  Colonic diverticulosis.  Retroperitoneum: No mass or adenopathy.  Peritoneum: No free fluid or gas.  Reproductive: Hysterectomy.  Vascular: No acute abnormality.  OSSEOUS: Multilevel degenerative disc and facet disease, disc narrowing most notable at L3-4 L4-5.  IMPRESSION:  1.  No hydronephrosis or urolithiasis. 2. Colonic diverticulosis.   Original Report Authenticated By: Tiburcio Pea     Imaging Review No results found. 10 PM pain improved after treatment with intravenous hydromorphone. MDM  No diagnosis found. Etiology of pain is unclear. There is no evidence of gallstone pancreatitis based on lab work Plan prescription Percocet. She has a scheduled appointment with her gastroenterologist scheduled for later this month.  Diagnosis nonspecific abdominal pain   I personally performed the services described in this documentation, which was scribed in my presence. The recorded information has been reviewed and considered.   [name], MD 01/08/13 2213

## 2013-01-08 NOTE — ED Notes (Addendum)
Pt continues to have upper right abdominal pain since Thursday.  Was seen here on Friday and sent home.  Medications are not helping. No N/V/D.

## 2014-04-03 ENCOUNTER — Emergency Department (HOSPITAL_BASED_OUTPATIENT_CLINIC_OR_DEPARTMENT_OTHER): Payer: Medicare Other

## 2014-04-03 ENCOUNTER — Emergency Department (HOSPITAL_BASED_OUTPATIENT_CLINIC_OR_DEPARTMENT_OTHER)
Admission: EM | Admit: 2014-04-03 | Discharge: 2014-04-03 | Disposition: A | Discharge status: Home / Self Care | Payer: Medicare Other | Attending: Emergency Medicine | Admitting: Emergency Medicine

## 2014-04-03 ENCOUNTER — Encounter (HOSPITAL_BASED_OUTPATIENT_CLINIC_OR_DEPARTMENT_OTHER): Payer: Self-pay | Admitting: *Deleted

## 2014-04-03 DIAGNOSIS — M79642 Pain in left hand: Secondary | ICD-10-CM | POA: Insufficient documentation

## 2014-04-03 DIAGNOSIS — K589 Irritable bowel syndrome without diarrhea: Secondary | ICD-10-CM | POA: Insufficient documentation

## 2014-04-03 DIAGNOSIS — Z87448 Personal history of other diseases of urinary system: Secondary | ICD-10-CM | POA: Insufficient documentation

## 2014-04-03 DIAGNOSIS — Z8639 Personal history of other endocrine, nutritional and metabolic disease: Secondary | ICD-10-CM | POA: Insufficient documentation

## 2014-04-03 DIAGNOSIS — M79643 Pain in unspecified hand: Secondary | ICD-10-CM

## 2014-04-03 DIAGNOSIS — Z79899 Other long term (current) drug therapy: Secondary | ICD-10-CM | POA: Diagnosis not present

## 2014-04-03 DIAGNOSIS — M797 Fibromyalgia: Secondary | ICD-10-CM | POA: Insufficient documentation

## 2014-04-03 LAB — CBC WITH DIFFERENTIAL/PLATELET
BASOS ABS: 0 10*3/uL (ref 0.0–0.1)
Basophils Relative: 0 % (ref 0–1)
Eosinophils Absolute: 0.1 10*3/uL (ref 0.0–0.7)
Eosinophils Relative: 1 % (ref 0–5)
HEMATOCRIT: 40.8 % (ref 36.0–46.0)
Hemoglobin: 13.4 g/dL (ref 12.0–15.0)
LYMPHS ABS: 1.6 10*3/uL (ref 0.7–4.0)
LYMPHS PCT: 27 % (ref 12–46)
MCH: 30.2 pg (ref 26.0–34.0)
MCHC: 32.8 g/dL (ref 30.0–36.0)
MCV: 91.9 fL (ref 78.0–100.0)
Monocytes Absolute: 0.4 10*3/uL (ref 0.1–1.0)
Monocytes Relative: 7 % (ref 3–12)
NEUTROS ABS: 3.9 10*3/uL (ref 1.7–7.7)
Neutrophils Relative %: 65 % (ref 43–77)
PLATELETS: 163 10*3/uL (ref 150–400)
RBC: 4.44 MIL/uL (ref 3.87–5.11)
RDW: 13.3 % (ref 11.5–15.5)
WBC: 5.9 10*3/uL (ref 4.0–10.5)

## 2014-04-03 LAB — BASIC METABOLIC PANEL
ANION GAP: 13 (ref 5–15)
BUN: 12 mg/dL (ref 6–23)
CHLORIDE: 102 meq/L (ref 96–112)
CO2: 26 meq/L (ref 19–32)
Calcium: 9.4 mg/dL (ref 8.4–10.5)
Creatinine, Ser: 1.2 mg/dL — ABNORMAL HIGH (ref 0.50–1.10)
GFR calc Af Amer: 54 mL/min — ABNORMAL LOW (ref >90)
GFR calc non Af Amer: 46 mL/min — ABNORMAL LOW (ref >90)
GLUCOSE: 106 mg/dL — AB (ref 70–99)
POTASSIUM: 3.7 meq/L (ref 3.7–5.3)
SODIUM: 141 meq/L (ref 137–147)

## 2014-04-03 MED ORDER — OXYCODONE-ACETAMINOPHEN 5-325 MG PO TABS
1.0000 | ORAL_TABLET | Freq: Once | ORAL | Status: AC
  Administered 2014-04-03: 1 via ORAL
  Filled 2014-04-03: qty 1

## 2014-04-03 MED ORDER — OXYCODONE-ACETAMINOPHEN 5-325 MG PO TABS: 1.0000 | ORAL_TABLET | ORAL | Status: AC | PRN

## 2014-04-03 NOTE — ED Notes (Signed)
Pt c/o left hand pain w/o injury x 2 hrs

## 2014-04-03 NOTE — ED Provider Notes (Signed)
CSN: [id number]     Arrival date & time 04/03/14  1530 History   First MD Initiated Contact with Patient 04/03/14 1540     Chief Complaint  Patient presents with  . Hand Pain     (Consider location/radiation/quality/duration/timing/severity/associated sxs/prior Treatment) Patient is a 63 y.o. female presenting with hand pain.  Hand Pain This is a new problem. The current episode started 3 to 5 hours ago. The problem occurs constantly. The problem has been gradually worsening. Pertinent negatives include no chest pain, no abdominal pain, no headaches and no shortness of breath. Exacerbated by: use, palpation. Nothing relieves the symptoms. She has tried nothing for the symptoms.    Past Medical History  Diagnosis Date  . Immune deficiency disorder   . Fibromyalgia   . Interstitial cystitis   . IBS (irritable bowel syndrome)   . Chronic fatigue syndrome    Past Surgical History  Procedure Laterality Date  . Appendectomy    . Abdominal hysterectomy    . Cholecystectomy     History reviewed. No pertinent family history. History  Substance Use Topics  . Smoking status: Never Smoker   . Smokeless tobacco: Not on file  . Alcohol Use: 0.6 oz/week    1 Glasses of wine per week   OB History    No data available     Review of Systems  Respiratory: Negative for shortness of breath.   Cardiovascular: Negative for chest pain.  Gastrointestinal: Negative for abdominal pain.  Neurological: Negative for headaches.  All other systems reviewed and are negative.     Allergies  Codeine  Home Medications   Prior to Admission medications   Medication Sig Start Date End Date Taking? Authorizing Provider  Ascorbic Acid (VITAMIN C) 1000 MG tablet Take 1,000 mg by mouth daily.    Historical Provider, MD  buPROPion (WELLBUTRIN XL) 300 MG 24 hr tablet Take 300 mg by mouth daily.    Historical Provider, MD  calcium citrate (CALCITRATE - DOSED IN MG ELEMENTAL CALCIUM) 950 MG tablet Take  1 tablet by mouth daily.    Historical Provider, MD  cholecalciferol (VITAMIN D) 1000 UNITS tablet Take 3,000 Units by mouth daily.    Historical Provider, MD  clonazePAM (KLONOPIN) 1 MG tablet Take 1 mg by mouth at bedtime as needed for anxiety.    Historical Provider, MD  dicyclomine (BENTYL) 20 MG tablet Take 1 tablet (20 mg total) by mouth 2 (two) times daily. 01/06/13   April K Palumbo-Rasch, MD  estradiol (VIVELLE-DOT) 0.05 MG/24HR patch Place 1 patch onto the skin once a week.    Historical Provider, MD  gabapentin (NEURONTIN) 600 MG tablet Take 600 mg by mouth 3 (three) times daily.    Historical Provider, MD  glucosamine-chondroitin 500-400 MG tablet Take 1 tablet by mouth 3 (three) times daily.    Historical Provider, MD  hydrOXYzine (ATARAX/VISTARIL) 25 MG tablet Take 50 mg by mouth at bedtime as needed for itching.    Historical Provider, MD  hyoscyamine (ANASPAZ) 0.125 MG TBDP tablet Place 0.375 mg under the tongue.    Historical Provider, MD  multivitamin-iron-minerals-folic acid (CENTRUM) chewable tablet Chew 1 tablet by mouth daily.    Historical Provider, MD  nebivolol (BYSTOLIC) 10 MG tablet Take 10 mg by mouth daily.    Historical Provider, MD  oxyCODONE-acetaminophen (PERCOCET/ROXICET) 5-325 MG per tablet Take 1-2 tablets by mouth every 4 (four) hours as needed for severe pain. 04/03/14   Mirian Mo, MD  pentosan polysulfate [facility])  100 MG capsule Take 200 mg by mouth 2 (two) times daily.    Historical Provider, MD  potassium chloride (K-DUR,KLOR-CON) 10 MEQ tablet Take 10 mEq by mouth 2 (two) times daily.    Historical Provider, MD  rOPINIRole (REQUIP) 1 MG tablet Take 2 mg by mouth at bedtime.    Historical Provider, MD  sucralfate (CARAFATE) 1 GM/10ML suspension Take 10 mLs (1 g total) by mouth 4 (four) times daily. 01/06/13   April K Palumbo-Rasch, MD  traZODone (DESYREL) 50 MG tablet Take 50 mg by mouth at bedtime.    Historical Provider, MD   BP 142/92 mmHg  Pulse 78   Temp(Src) 97.6 F (36.4 C)  Resp 16  Ht 5\' 8"  (1.727 m)  Wt 175 lb (79.379 kg)  BMI 26.61 kg/m2  SpO2 100% Physical Exam  Constitutional: She is oriented to person, place, and time. She appears well-developed and well-nourished.  HENT:  Head: Normocephalic and atraumatic.  Right Ear: External ear normal.  Left Ear: External ear normal.  Eyes: Conjunctivae and EOM are normal. Pupils are equal, round, and reactive to light.  Neck: Normal range of motion. Neck supple.  Cardiovascular: Normal rate, regular rhythm, normal heart sounds and intact distal pulses.   Pulmonary/Chest: Effort normal and breath sounds normal.  Abdominal: Soft. Bowel sounds are normal. There is no tenderness.  Musculoskeletal: Normal range of motion.  bil symmetric swelling of MCP joints.  Mild spotty erythema of vasculature more prominent on L.  FROM with pain.  No warmth or joint erythema.  Neurological: She is alert and oriented to person, place, and time.  Skin: Skin is warm and dry.  Vitals reviewed.   ED Course  Procedures (including critical care time) Labs Review Labs Reviewed  BASIC METABOLIC PANEL - Abnormal; Notable for the following:    Glucose, Bld 106 (*)    Creatinine, Ser 1.20 (*)    GFR calc non Af Amer 46 (*)    GFR calc Af Amer 54 (*)    All other components within normal limits  CBC WITH DIFFERENTIAL    Imaging Review Dg Hand Complete Left  04/03/2014   CLINICAL DATA:  Left hand pain starting about 3 hr ago third metacarpal pain, no known injury  EXAM: LEFT HAND - COMPLETE 3+ VIEW  COMPARISON:  None.  FINDINGS: Three views of left hand submitted. No acute fracture or subluxation. Mild degenerative changes interphalangeal joints. Mild narrowing of radiocarpal joint space. Moderate degenerative changes first carpometacarpal joint. Mild spurring of distal aspect third metacarpal.  IMPRESSION: No acute fracture or subluxation. Degenerative changes as described above.   Electronically Signed    By: Natasha MeadLiviu  Pop M.D.   On: 04/03/2014 16:21     EKG Interpretation None      MDM   Final diagnoses:  Hand pain    63 y.o. female with pertinent PMH of fibromyalgia, IC, IBS, chronic fatigue syndrome presents with atraumatic left hand and wrist pain as described above. Patient states she was eating lunch when symptoms began. On examination the patient has swelling of all of her MCP joints and questionable ulnar deviation of fingers. Given that the patient has had significant workup in the past and appearance suspect potentially rheumatoid arthritis with flare. Doubt gout, septic arthritis, other emergent pathology. The pain in arm or chest to suggest occult cardiac etiology. Patient systemically well. She was given Percocet with resolution of pain. She will touch base with her PCP for rheumatology fu.  1. Hand pain         Mirian MoMatthew Arnulfo Batson, MD 04/03/14 [phone]

## 2014-04-03 NOTE — Discharge Instructions (Signed)

## 2020-06-14 ENCOUNTER — Other Ambulatory Visit: Payer: Self-pay

## 2020-06-14 ENCOUNTER — Encounter (HOSPITAL_BASED_OUTPATIENT_CLINIC_OR_DEPARTMENT_OTHER): Payer: Self-pay | Admitting: *Deleted

## 2020-06-14 ENCOUNTER — Emergency Department (HOSPITAL_BASED_OUTPATIENT_CLINIC_OR_DEPARTMENT_OTHER)
Admission: EM | Admit: 2020-06-14 | Discharge: 2020-06-14 | Disposition: A | Discharge status: Home / Self Care | Payer: Medicare PPO | Attending: Emergency Medicine | Admitting: Emergency Medicine

## 2020-06-14 ENCOUNTER — Emergency Department (HOSPITAL_BASED_OUTPATIENT_CLINIC_OR_DEPARTMENT_OTHER): Payer: Medicare PPO

## 2020-06-14 DIAGNOSIS — Y9301 Activity, walking, marching and hiking: Secondary | ICD-10-CM | POA: Diagnosis not present

## 2020-06-14 DIAGNOSIS — W010XXA Fall on same level from slipping, tripping and stumbling without subsequent striking against object, initial encounter: Secondary | ICD-10-CM | POA: Diagnosis not present

## 2020-06-14 DIAGNOSIS — M79671 Pain in right foot: Secondary | ICD-10-CM | POA: Insufficient documentation

## 2020-06-14 DIAGNOSIS — W19XXXA Unspecified fall, initial encounter: Secondary | ICD-10-CM

## 2020-06-14 DIAGNOSIS — M25571 Pain in right ankle and joints of right foot: Secondary | ICD-10-CM | POA: Diagnosis not present

## 2020-06-14 NOTE — ED Triage Notes (Signed)
C/o fall x 2 hrs ago with right foot ankle injury

## 2020-06-14 NOTE — ED Provider Notes (Addendum)
[facility] EMERGENCY DEPARTMENT Provider Note   CSN: [id number] Arrival date & time: 06/14/20  1523     History Chief Complaint  Patient presents with  . Fall    Connie Parker is a 70 y.o. female.  HPI   70 year old female with a history of fibromyalgia, IBS, immune deficiency disorder, interstitial cystitis, VTE, who presents to the emergency department today for evaluation after a fall.  States she was walking to get the mail when she tripped and fell.  She caught herself with her right hand and left knee and she also hurt her right foot/ankle.  She denies any head trauma or LOC.  She is mainly only complaining of pain to the right ankle/foot.  Past Medical History:  Diagnosis Date  . Chronic fatigue syndrome   . Fibromyalgia   . IBS (irritable bowel syndrome)   . Immune deficiency disorder (HCC)   . Interstitial cystitis     There are no problems to display for this patient.   Past Surgical History:  Procedure Laterality Date  . ABDOMINAL HYSTERECTOMY    . APPENDECTOMY    . CHOLECYSTECTOMY       OB History   No obstetric history on file.     No family history on file.  Social History   Tobacco Use  . Smoking status: Never Smoker  Substance Use Topics  . Alcohol use: Yes    Alcohol/week: 1.0 standard drink    Types: 1 Glasses of wine per week    Home Medications Prior to Admission medications   Medication Sig Start Date End Date Taking? Authorizing Provider  Ascorbic Acid (VITAMIN C) 1000 MG tablet Take 1,000 mg by mouth daily.    [provider]  buPROPion (WELLBUTRIN XL) 300 MG 24 hr tablet Take 300 mg by mouth daily.    [provider]  calcium citrate (CALCITRATE - DOSED IN MG ELEMENTAL CALCIUM) 950 MG tablet Take 1 tablet by mouth daily.    [provider]  cholecalciferol (VITAMIN D) 1000 UNITS tablet Take 3,000 Units by mouth daily.    [provider]  clonazePAM (KLONOPIN) 1 MG tablet Take 1 mg  by mouth at bedtime as needed for anxiety.    [provider]  dicyclomine (BENTYL) 20 MG tablet Take 1 tablet (20 mg total) by mouth 2 (two) times daily. 01/06/13   Palumbo, April, MD  estradiol (VIVELLE-DOT) 0.05 MG/24HR patch Place 1 patch onto the skin once a week.    [provider]  gabapentin (NEURONTIN) 600 MG tablet Take 600 mg by mouth 3 (three) times daily.    [provider]  glucosamine-chondroitin 500-400 MG tablet Take 1 tablet by mouth 3 (three) times daily.    [provider]  hydrOXYzine (ATARAX/VISTARIL) 25 MG tablet Take 50 mg by mouth at bedtime as needed for itching.    [provider]  hyoscyamine (ANASPAZ) 0.125 MG TBDP tablet Place 0.375 mg under the tongue.    [provider]  multivitamin-iron-minerals-folic acid (CENTRUM) chewable tablet Chew 1 tablet by mouth daily.    [provider]  nebivolol (BYSTOLIC) 10 MG tablet Take 10 mg by mouth daily.    [provider]  oxyCODONE-acetaminophen (PERCOCET/ROXICET) 5-325 MG per tablet Take 1-2 tablets by mouth every 4 (four) hours as needed for severe pain. 04/03/14   Mirian Mo, MD  pentosan polysulfate (ELMIRON) 100 MG capsule Take 200 mg by mouth 2 (two) times daily.    [provider]  potassium chloride (K-DUR,KLOR-CON) 10 MEQ tablet Take 10 mEq by mouth 2 (two) times daily.    [provider]  rOPINIRole (REQUIP) 1 MG tablet Take 2 mg by mouth at bedtime.    [provider]  sucralfate (CARAFATE) 1 GM/10ML suspension Take 10 mLs (1 g total) by mouth 4 (four) times daily. 01/06/13   Palumbo, April, MD  traZODone (DESYREL) 50 MG tablet Take 50 mg by mouth at bedtime.    [provider]    Allergies    Amlodipine besylate, Morphine sulfate, Prochlorperazine, Simvastatin, Tizanidine, Tramadol, Dhea, Ezetimibe, Codeine, and Pneumococcal vaccine  Review of Systems   Review of Systems  Constitutional: Negative for  fever.  Eyes: Negative for visual disturbance.  Respiratory: Negative for shortness of breath.   Cardiovascular: Negative for chest pain.  Gastrointestinal: Negative for abdominal pain.  Genitourinary: Negative for pelvic pain.  Musculoskeletal: Negative for back pain.       Right ankle/foot pain   Neurological: Negative for dizziness, weakness, light-headedness and numbness.       No head injury or LOC    Physical Exam Updated Vital Signs BP 136/74   Pulse 88   Temp 98.2 F (36.8 C) (Oral)   Resp 18   Ht 5\' 8"  (1.727 m)   Wt 82.6 kg   SpO2 99%   BMI 27.67 kg/m   Physical Exam Vitals and nursing note reviewed.  Constitutional:      General: She is not in acute distress.    Appearance: She is well-developed and well-nourished.  HENT:     Head: Normocephalic and atraumatic.  Eyes:     Conjunctiva/sclera: Conjunctivae normal.  Cardiovascular:     Rate and Rhythm: Normal rate.  Pulmonary:     Effort: Pulmonary effort is normal.  Abdominal:     Palpations: Abdomen is soft.     Tenderness: There is no abdominal tenderness.  Musculoskeletal:        General: No edema.     Cervical back: Neck supple.     Comments: TTP to the mid/lateral foot and over the talus. nvi distally.   Skin:    General: Skin is warm and dry.  Neurological:     Mental Status: She is alert.  Psychiatric:        Mood and Affect: Mood and affect normal.     ED Results / Procedures / Treatments   Labs (all labs ordered are listed, but only abnormal results are displayed) Labs Reviewed - No data to display  EKG None  Radiology DG Ankle Complete Right  Result Date: 06/14/2020 CLINICAL DATA:  Twisted right ankle, right ankle pain EXAM: RIGHT ANKLE - COMPLETE 3+ VIEW COMPARISON:  None. FINDINGS: Three view radiograph right ankle demonstrates normal alignment. Ankle mortise preserved. No fracture or dislocation. No ankle effusion. Mild bimalleolar soft tissue swelling. IMPRESSION: Mild  bimalleolar soft tissue swelling.  No fracture or dislocation. Electronically Signed   By: 08/12/2020 MD   On: 06/14/2020 15:55   DG Foot Complete Right  Result Date: 06/14/2020 CLINICAL DATA:  Fall, right foot pain EXAM: RIGHT FOOT COMPLETE - 3+ VIEW COMPARISON:  None. FINDINGS: Mild hallux valgus deformity. No acute fracture or dislocation. Joint spaces are preserved. Soft tissues are unremarkable. Tiny plantar calcaneal spur. IMPRESSION: No acute fracture or dislocation. Electronically Signed   By: 08/12/2020 MD   On: 06/14/2020 15:56    Procedures Procedures   Medications Ordered in ED Medications - No  data to display  ED Course  I have reviewed the triage vital signs and the nursing notes.  Pertinent labs & imaging results that were available during my care of the patient were reviewed by me and considered in my medical decision making (see chart for details).    MDM Rules/Calculators/A&P                          70 y/o F presenting the emergency department today for evaluation after mechanical fall.  Is complaining of pain to the right ankle/foot area.  She denies any head trauma or LOC.  X-ray of the right foot/ankle reviewed and does not show any evidence of fracture or dislocation.  She was placed in ASO splint.  She has a walker at home that she will use.  She has an orthopedic doctor and I recommended to follow-up with them with in the next week for reassessment.  Advised over-the-counter pain medications, rice protocol and weightbearing as tolerated.  Advised on specific return precautions.  She voices understanding of the plan and reasons to return.  All questions answered.  Patient stable for discharge.  Final Clinical Impression(s) / ED Diagnoses Final diagnoses:  Fall, initial encounter  Right foot pain  Acute right ankle pain    Rx / DC Orders ED Discharge Orders    None       Karrie Meres, PA-C 06/14/20 1714    Karrie Meres, PA-C 06/14/20  1715    Tilden Fossa, MD 06/14/20 1722

## 2020-06-14 NOTE — Discharge Instructions (Signed)
You may alternate taking Tylenol as needed for pain control. You may take [phone] mg of Tylenol every 6 hours. Do not exceed 4000 mg of Tylenol daily as this can lead to liver damage. You may use warm and cold compresses to help with your symptoms.   Please follow up with your primary care provider of your orthopedic doctor within 5-7 days for re-evaluation of your symptoms. If you do not have a primary care provider, information for a healthcare clinic has been provided for you to make arrangements for follow up care. Please return to the emergency department for any new or worsening symptoms.

## 2021-01-28 ENCOUNTER — Encounter (HOSPITAL_BASED_OUTPATIENT_CLINIC_OR_DEPARTMENT_OTHER): Payer: Self-pay | Admitting: *Deleted

## 2021-01-28 ENCOUNTER — Other Ambulatory Visit: Payer: Self-pay

## 2021-01-28 ENCOUNTER — Emergency Department (HOSPITAL_BASED_OUTPATIENT_CLINIC_OR_DEPARTMENT_OTHER)
Admission: EM | Admit: 2021-01-28 | Discharge: 2021-01-28 | Disposition: A | Discharge status: Home / Self Care | Payer: Medicare PPO | Attending: Emergency Medicine | Admitting: Emergency Medicine

## 2021-01-28 DIAGNOSIS — N390 Urinary tract infection, site not specified: Secondary | ICD-10-CM | POA: Insufficient documentation

## 2021-01-28 DIAGNOSIS — R1030 Lower abdominal pain, unspecified: Secondary | ICD-10-CM | POA: Diagnosis present

## 2021-01-28 LAB — URINALYSIS, MICROSCOPIC (REFLEX)

## 2021-01-28 LAB — URINALYSIS, ROUTINE W REFLEX MICROSCOPIC
Bilirubin Urine: NEGATIVE
Glucose, UA: NEGATIVE mg/dL
Ketones, ur: NEGATIVE mg/dL
Nitrite: POSITIVE — AB
Protein, ur: NEGATIVE mg/dL
Specific Gravity, Urine: 1.005 (ref 1.005–1.030)
pH: 5 (ref 5.0–8.0)

## 2021-01-28 MED ORDER — CEPHALEXIN 250 MG PO CAPS
500.0000 mg | ORAL_CAPSULE | Freq: Once | ORAL | Status: AC
  Administered 2021-01-28: 500 mg via ORAL
  Filled 2021-01-28: qty 2

## 2021-01-28 MED ORDER — CEPHALEXIN 500 MG PO CAPS: 500.0000 mg | ORAL_CAPSULE | Freq: Four times a day (QID) | ORAL | 0 refills | Status: AC

## 2021-01-28 NOTE — Discharge Instructions (Addendum)
Urology should be following a culture.  They can adjust medications depending on what it shows.

## 2021-01-28 NOTE — ED Triage Notes (Signed)
C/o DX uti and needs ABX

## 2021-01-28 NOTE — ED Provider Notes (Signed)
[facility] EMERGENCY DEPARTMENT Provider Note   CSN: [id number] Arrival date & time: 01/28/21  1913     History Chief Complaint  Patient presents with   Urinary Tract Infection    Connie Parker is a 70 y.o. female.   Urinary Tract Infection Associated symptoms: abdominal pain   Patient has a urinary tract infection.  Seen at urology today.  Has history of interstitial cystitis.  Had treatment yesterday and again today.  States she is in trouble with the treatment swelling in her bladder.  States urology thinks it may have been because of the infection.  Urinalysis at urology today showed infection but they were not treating empirically initially and waiting on the culture.  However she was due to have eye surgery tomorrow.  Urology was waiting on clearance from ophthalmology for the antibiotics.  However patient has now canceled the eye surgery.  States she needs antibiotics because her abdomen is more patent.  States the pain is in the lower abdomen.  States has had a little chills but not a frank fever.  Patient's thinks she has had Keflex in the past successfully.  Does not believe she has had resistant organisms.    Past Medical History:  Diagnosis Date   Chronic fatigue syndrome    Fibromyalgia    IBS (irritable bowel syndrome)    Immune deficiency disorder (HCC)    Interstitial cystitis     There are no problems to display for this patient.   Past Surgical History:  Procedure Laterality Date   ABDOMINAL HYSTERECTOMY     APPENDECTOMY     CHOLECYSTECTOMY       OB History   No obstetric history on file.     History reviewed. No pertinent family history.  Social History   Tobacco Use   Smoking status: Never  Substance Use Topics   Alcohol use: Yes    Alcohol/week: 1.0 standard drink    Types: 1 Glasses of wine per week    Home Medications Prior to Admission medications   Medication Sig Start Date End Date Taking? Authorizing Provider   cephALEXin (KEFLEX) 500 MG capsule Take 1 capsule (500 mg total) by mouth 4 (four) times daily. 01/28/21  Yes Benjiman Core, MD  Ascorbic Acid (VITAMIN C) 1000 MG tablet Take 1,000 mg by mouth daily.    [provider]  buPROPion (WELLBUTRIN XL) 300 MG 24 hr tablet Take 300 mg by mouth daily.    [provider]  calcium citrate (CALCITRATE - DOSED IN MG ELEMENTAL CALCIUM) 950 MG tablet Take 1 tablet by mouth daily.    [provider]  cholecalciferol (VITAMIN D) 1000 UNITS tablet Take 3,000 Units by mouth daily.    [provider]  clonazePAM (KLONOPIN) 1 MG tablet Take 1 mg by mouth at bedtime as needed for anxiety.    [provider]  dicyclomine (BENTYL) 20 MG tablet Take 1 tablet (20 mg total) by mouth 2 (two) times daily. 01/06/13   Palumbo, April, MD  estradiol (VIVELLE-DOT) 0.05 MG/24HR patch Place 1 patch onto the skin once a week.    [provider]  gabapentin (NEURONTIN) 600 MG tablet Take 600 mg by mouth 3 (three) times daily.    [provider]  glucosamine-chondroitin 500-400 MG tablet Take 1 tablet by mouth 3 (three) times daily.    [provider]  hydrOXYzine (ATARAX/VISTARIL) 25 MG tablet Take 50 mg by mouth at bedtime as needed for itching.  [provider]  hyoscyamine (ANASPAZ) 0.125 MG TBDP tablet Place 0.375 mg under the tongue.    [provider]  multivitamin-iron-minerals-folic acid (CENTRUM) chewable tablet Chew 1 tablet by mouth daily.    [provider]  nebivolol (BYSTOLIC) 10 MG tablet Take 10 mg by mouth daily.    [provider]  oxyCODONE-acetaminophen (PERCOCET/ROXICET) 5-325 MG per tablet Take 1-2 tablets by mouth every 4 (four) hours as needed for severe pain. 04/03/14   Mirian Mo, MD  pentosan polysulfate (ELMIRON) 100 MG capsule Take 200 mg by mouth 2 (two) times daily.    [provider]  potassium chloride (K-DUR,KLOR-CON) 10 MEQ  tablet Take 10 mEq by mouth 2 (two) times daily.    [provider]  rOPINIRole (REQUIP) 1 MG tablet Take 2 mg by mouth at bedtime.    [provider]  sucralfate (CARAFATE) 1 GM/10ML suspension Take 10 mLs (1 g total) by mouth 4 (four) times daily. 01/06/13   Palumbo, April, MD  traZODone (DESYREL) 50 MG tablet Take 50 mg by mouth at bedtime.    [provider]    Allergies    Amlodipine besylate, Morphine sulfate, Prochlorperazine, Simvastatin, Tizanidine, Tramadol, Dhea, Ezetimibe, Codeine, and Pneumococcal vaccine  Review of Systems   Review of Systems  Constitutional:  Positive for chills and fatigue. Negative for appetite change.  Respiratory:  Negative for shortness of breath.   Cardiovascular:  Negative for chest pain.  Gastrointestinal:  Positive for abdominal pain.  Genitourinary:  Positive for dysuria.  Musculoskeletal:  Negative for back pain.  Skin:  Negative for rash.  Neurological:  Negative for weakness.  Psychiatric/Behavioral:  Negative for confusion.    Physical Exam Updated Vital Signs BP (!) 147/91 (BP Location: Right Arm)   Pulse 83   Temp 98.7 F (37.1 C) (Oral)   Resp 16   Ht 5' 6.5" (1.689 m)   Wt 86.2 kg   SpO2 98%   BMI 30.21 kg/m   Physical Exam Vitals and nursing note reviewed.  HENT:     Head: Atraumatic.  Eyes:     Pupils: Pupils are equal, round, and reactive to light.  Cardiovascular:     Rate and Rhythm: Regular rhythm.  Pulmonary:     Effort: Pulmonary effort is normal.  Abdominal:     Tenderness: There is abdominal tenderness.     Comments: Lower abdominal tenderness without rebound or guarding.  Also has ileostomy.  Musculoskeletal:        General: No tenderness.     Cervical back: Neck supple.  Skin:    General: Skin is warm.     Capillary Refill: Capillary refill takes less than 2 seconds.  Neurological:     Mental Status: She is alert and oriented to person, place, and time.    ED Results /  Procedures / Treatments   Labs (all labs ordered are listed, but only abnormal results are displayed) Labs Reviewed  URINALYSIS, ROUTINE W REFLEX MICROSCOPIC - Abnormal; Notable for the following components:      Result Value   Color, Urine ORANGE (*)    Hgb urine dipstick MODERATE (*)    Nitrite POSITIVE (*)    Leukocytes,Ua MODERATE (*)    All other components within normal limits  URINALYSIS, MICROSCOPIC (REFLEX) - Abnormal; Notable for the following components:   Bacteria, UA FEW (*)    All other components within normal limits    EKG None  Radiology No results found.  Procedures  Procedures   Medications Ordered in ED Medications  cephALEXin (KEFLEX) capsule 500 mg (has no administration in time range)    ED Course  I have reviewed the triage vital signs and the nursing notes.  Pertinent labs & imaging results that were available during my care of the patient were reviewed by me and considered in my medical decision making (see chart for details).    MDM Rules/Calculators/A&P                           Patient with UTI.  History of same.  History of interstitial cystitis.  Reviewing urology notes they are planning to treat with antibiotics empirically pending clearance.  Patient canceled that procedure.  They were going to follow culture.  We will treat with Keflex now.  They can still follow culture.  5 days of treatment given.  Well-appearing.  Not feels we need further work-up this time.  Will not repeat culture since urology already has one from earlier today.  Discharge home Final Clinical Impression(s) / ED Diagnoses Final diagnoses:  Lower urinary tract infectious disease    Rx / DC Orders ED Discharge Orders          Ordered    cephALEXin (KEFLEX) 500 MG capsule  4 times daily        01/28/21 2202             Benjiman Core, MD 01/28/21 2209

## 2023-04-11 ENCOUNTER — Encounter (HOSPITAL_BASED_OUTPATIENT_CLINIC_OR_DEPARTMENT_OTHER): Payer: Self-pay

## 2023-04-11 ENCOUNTER — Emergency Department (HOSPITAL_BASED_OUTPATIENT_CLINIC_OR_DEPARTMENT_OTHER): Payer: Medicare PPO

## 2023-04-11 ENCOUNTER — Other Ambulatory Visit: Payer: Self-pay

## 2023-04-11 ENCOUNTER — Emergency Department (HOSPITAL_BASED_OUTPATIENT_CLINIC_OR_DEPARTMENT_OTHER)
Admission: EM | Admit: 2023-04-11 | Discharge: 2023-04-11 | Disposition: A | Discharge status: Home / Self Care | Payer: Medicare PPO | Attending: Emergency Medicine | Admitting: Emergency Medicine

## 2023-04-11 DIAGNOSIS — M79605 Pain in left leg: Secondary | ICD-10-CM | POA: Diagnosis present

## 2023-04-11 NOTE — ED Notes (Signed)
Pt alert and oriented X 4 at the time of discharge. RR even and unlabored. No acute distress noted. Pt verbalized understanding of discharge instructions as discussed. Pt ambulatory to lobby at time of discharge.

## 2023-04-11 NOTE — Discharge Instructions (Signed)
Ultrasound negative for blood clot to the leg.  This is very reassuring.  Continue your current medications.  Follow-up with your doctor as needed.

## 2023-04-11 NOTE — ED Provider Notes (Addendum)
[place] EMERGENCY DEPARTMENT AT [facility] Provider Note   CSN: [id number] Arrival date & time: 04/11/23  1055     History  Chief Complaint  Patient presents with   Leg Pain    KLOI BAZ is a 72 y.o. female.  Patient states she is on Xarelto.  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  Was told by hematology oncology at [facility] that they would continue her on anticoagulation therapy.  Patient without any chest pain or shortness of breath.  Oxygen saturation here is 98%.  Patient started with some left calf discomfort on and off for the past few days and then got a little bit worse overnight.  No fall or injury.       Home Medications Prior to Admission medications   Medication Sig Start Date End Date Taking? Authorizing Provider  Ascorbic Acid (VITAMIN C) 1000 MG tablet Take 1,000 mg by mouth daily.    [provider]  buPROPion (WELLBUTRIN XL) 300 MG 24 hr tablet Take 300 mg by mouth daily.    [provider]  calcium citrate (CALCITRATE - DOSED IN MG ELEMENTAL CALCIUM) 950 MG tablet Take 1 tablet by mouth daily.    [provider]  cephALEXin (KEFLEX) 500 MG capsule Take 1 capsule (500 mg total) by mouth 4 (four) times daily. 01/28/21   Benjiman Core, MD  cholecalciferol (VITAMIN D) 1000 UNITS tablet Take 3,000 Units by mouth daily.    [provider]  clonazePAM (KLONOPIN) 1 MG tablet Take 1 mg by mouth at bedtime as needed for anxiety.    [provider]  dicyclomine (BENTYL) 20 MG tablet Take 1 tablet (20 mg total) by mouth 2 (two) times daily. 01/06/13   Palumbo, April, MD  estradiol (VIVELLE-DOT) 0.05 MG/24HR patch Place 1 patch onto the skin once a week.    [provider]  gabapentin (NEURONTIN) 600 MG tablet Take 600 mg by mouth 3 (three) times daily.    [provider]  glucosamine-chondroitin 500-400 MG tablet Take 1 tablet by mouth 3 (three) times  daily.    [provider]  hydrOXYzine (ATARAX/VISTARIL) 25 MG tablet Take 50 mg by mouth at bedtime as needed for itching.    [provider]  hyoscyamine (ANASPAZ) 0.125 MG TBDP tablet Place 0.375 mg under the tongue.    [provider]  multivitamin-iron-minerals-folic acid (CENTRUM) chewable tablet Chew 1 tablet by mouth daily.    [provider]  nebivolol (BYSTOLIC) 10 MG tablet Take 10 mg by mouth daily.    [provider]  oxyCODONE-acetaminophen (PERCOCET/ROXICET) 5-325 MG per tablet Take 1-2 tablets by mouth every 4 (four) hours as needed for severe pain. 04/03/14   Mirian Mo, MD  pentosan polysulfate (ELMIRON) 100 MG capsule Take 200 mg by mouth 2 (two) times daily.    [provider]  potassium chloride (K-DUR,KLOR-CON) 10 MEQ tablet Take 10 mEq by mouth 2 (two) times daily.    [provider]  rOPINIRole (REQUIP) 1 MG tablet Take 2 mg by mouth at bedtime.    [provider]  sucralfate (CARAFATE) 1 GM/10ML suspension Take 10 mLs (1 g total) by mouth 4 (four) times daily. 01/06/13   Palumbo, April, MD  traZODone (DESYREL) 50 MG tablet Take 50 mg by mouth at bedtime.    [provider]      Allergies    Amlodipine besylate, Morphine sulfate, Prochlorperazine, Simvastatin, Tizanidine,  Tramadol, Dhea, Ezetimibe, Codeine, and Pneumococcal vaccine    Review of Systems   Review of Systems  Constitutional:  Negative for chills and fever.  HENT:  Negative for ear pain and sore throat.   Eyes:  Negative for pain and visual disturbance.  Respiratory:  Negative for cough and shortness of breath.   Cardiovascular:  Negative for chest pain, palpitations and leg swelling.  Gastrointestinal:  Negative for abdominal pain and vomiting.  Genitourinary:  Negative for dysuria and hematuria.  Musculoskeletal:  Negative for arthralgias and back pain.  Skin:  Negative for color change and rash.  Neurological:   Negative for seizures and syncope.  All other systems reviewed and are negative.   Physical Exam Updated Vital Signs BP (!) 158/89   Pulse 70   Temp 98 F (36.7 C) (Oral)   Resp 18   SpO2 95%  Physical Exam Vitals and nursing note reviewed.  Constitutional:      General: She is not in acute distress.    Appearance: Normal appearance. She is well-developed. She is not ill-appearing.  HENT:     Head: Normocephalic and atraumatic.  Eyes:     Extraocular Movements: Extraocular movements intact.     Conjunctiva/sclera: Conjunctivae normal.     Pupils: Pupils are equal, round, and reactive to light.  Cardiovascular:     Rate and Rhythm: Normal rate and regular rhythm.     Heart sounds: No murmur heard. Pulmonary:     Effort: Pulmonary effort is normal. No respiratory distress.     Breath sounds: Normal breath sounds. No wheezing, rhonchi or rales.  Abdominal:     Palpations: Abdomen is soft.     Tenderness: There is no abdominal tenderness.  Musculoskeletal:        General: Tenderness present. No swelling.     Cervical back: Normal range of motion and neck supple.     Comments: Mild tenderness to left calf.  No significant leg swelling bilaterally.  Dorsalis pedis pulse 2+ bilaterally.  Good movement of the toes.  Sensation grossly intact.  Skin:    General: Skin is warm and dry.     Capillary Refill: Capillary refill takes less than 2 seconds.  Neurological:     General: No focal deficit present.     Mental Status: She is alert and oriented to person, place, and time.  Psychiatric:        Mood and Affect: Mood normal.     ED Results / Procedures / Treatments   Labs (all labs ordered are listed, but only abnormal results are displayed) Labs Reviewed - No data to display  EKG None  Radiology No results found.  Procedures Procedures    Medications Ordered in ED Medications - No data to display  ED Course/ Medical Decision Making/ A&P                                  Medical Decision Making  Patient with history of right leg DVT and pulmonary embolus in the past.  Currently taking Xarelto.  Will go ahead and get left leg ultrasound Doppler studies.  Patient without any findings suggestive of pulmonary embolus.  Oxygen saturations good.  No chest pain no shortness of breath.  Ultrasound DVT study of the left leg is negative.  No evidence of any DVT.  This is reassuring. Final Clinical Impression(s) / ED Diagnoses Final diagnoses:  Left leg pain  Rx / DC Orders ED Discharge Orders     None         Vanetta Mulders, MD 04/11/23 4098    Vanetta Mulders, MD 04/11/23 1256

## 2023-04-11 NOTE — ED Triage Notes (Addendum)
Pt presents pov with complaints of left calf pain X 3 days. Hx of DVT and  PE.  Denies chest pain & SOB.
